# Patient Record
Sex: Female | Born: 1940 | Race: White | Hispanic: No | Marital: Married
Health system: Southern US, Community
[De-identification: ages and names within clinical notes are randomized; demographics above are authoritative.]

---

## 2005-02-24 ENCOUNTER — Ambulatory Visit: Payer: Self-pay | Admitting: Rheumatology

## 2005-03-04 ENCOUNTER — Ambulatory Visit: Payer: Self-pay | Admitting: Rheumatology

## 2005-04-10 ENCOUNTER — Ambulatory Visit: Payer: Self-pay | Admitting: Internal Medicine

## 2005-07-03 ENCOUNTER — Ambulatory Visit: Payer: Self-pay | Admitting: Internal Medicine

## 2005-07-28 ENCOUNTER — Ambulatory Visit: Payer: Self-pay | Admitting: Rheumatology

## 2005-08-18 ENCOUNTER — Ambulatory Visit: Payer: Self-pay | Admitting: Anesthesiology

## 2005-09-04 ENCOUNTER — Ambulatory Visit: Payer: Self-pay | Admitting: Anesthesiology

## 2005-09-30 ENCOUNTER — Ambulatory Visit: Payer: Self-pay | Admitting: Anesthesiology

## 2005-11-11 ENCOUNTER — Ambulatory Visit: Payer: Self-pay | Admitting: Internal Medicine

## 2005-11-17 ENCOUNTER — Ambulatory Visit: Payer: Self-pay | Admitting: Anesthesiology

## 2005-12-30 ENCOUNTER — Ambulatory Visit: Payer: Self-pay | Admitting: Anesthesiology

## 2006-01-25 ENCOUNTER — Emergency Department: Payer: Self-pay | Admitting: Emergency Medicine

## 2006-01-25 ENCOUNTER — Emergency Department: Payer: Self-pay | Admitting: General Practice

## 2006-04-08 ENCOUNTER — Other Ambulatory Visit: Payer: Self-pay

## 2006-04-24 ENCOUNTER — Inpatient Hospital Stay: Payer: Self-pay | Admitting: General Practice

## 2006-06-16 ENCOUNTER — Ambulatory Visit: Payer: Self-pay | Admitting: Internal Medicine

## 2006-08-06 ENCOUNTER — Inpatient Hospital Stay: Payer: Self-pay | Admitting: Internal Medicine

## 2006-08-06 ENCOUNTER — Other Ambulatory Visit: Payer: Self-pay

## 2006-09-09 ENCOUNTER — Ambulatory Visit: Payer: Self-pay | Admitting: Internal Medicine

## 2006-09-14 ENCOUNTER — Encounter: Payer: Self-pay | Admitting: Pulmonary Disease

## 2006-09-26 ENCOUNTER — Encounter: Payer: Self-pay | Admitting: Pulmonary Disease

## 2006-10-27 ENCOUNTER — Encounter: Payer: Self-pay | Admitting: Pulmonary Disease

## 2006-11-27 ENCOUNTER — Encounter: Payer: Self-pay | Admitting: Pulmonary Disease

## 2007-01-06 ENCOUNTER — Ambulatory Visit: Payer: Self-pay | Admitting: Rheumatology

## 2007-03-31 ENCOUNTER — Ambulatory Visit: Payer: Self-pay | Admitting: Internal Medicine

## 2007-06-21 ENCOUNTER — Ambulatory Visit: Payer: Self-pay | Admitting: Internal Medicine

## 2007-07-18 ENCOUNTER — Ambulatory Visit: Payer: Self-pay | Admitting: Internal Medicine

## 2007-08-17 ENCOUNTER — Ambulatory Visit: Payer: Self-pay | Admitting: Anesthesiology

## 2007-09-10 ENCOUNTER — Ambulatory Visit: Payer: Self-pay | Admitting: Anesthesiology

## 2007-10-11 ENCOUNTER — Ambulatory Visit: Payer: Self-pay | Admitting: Anesthesiology

## 2007-11-03 ENCOUNTER — Ambulatory Visit: Payer: Self-pay | Admitting: Anesthesiology

## 2007-12-20 ENCOUNTER — Ambulatory Visit: Payer: Self-pay | Admitting: Anesthesiology

## 2007-12-28 ENCOUNTER — Ambulatory Visit: Payer: Self-pay | Admitting: Anesthesiology

## 2008-02-15 ENCOUNTER — Ambulatory Visit: Payer: Self-pay | Admitting: Anesthesiology

## 2008-03-08 IMAGING — CR DG CHEST 2V
1 series · 2 of 2 positions shown · non-contrast
Comparison: none

REASON FOR EXAM: cough and congestion
COMMENTS:

PROCEDURE:     MDR - MDR CHEST PA(OR AP) AND LATERAL  - July 18, 2007  [DATE]
RESULT:     Comparison: 08/10/2006.

[Series 1: view not recorded · 0.17mm/px · 2 of 2 slices shown]
[im 1/2]
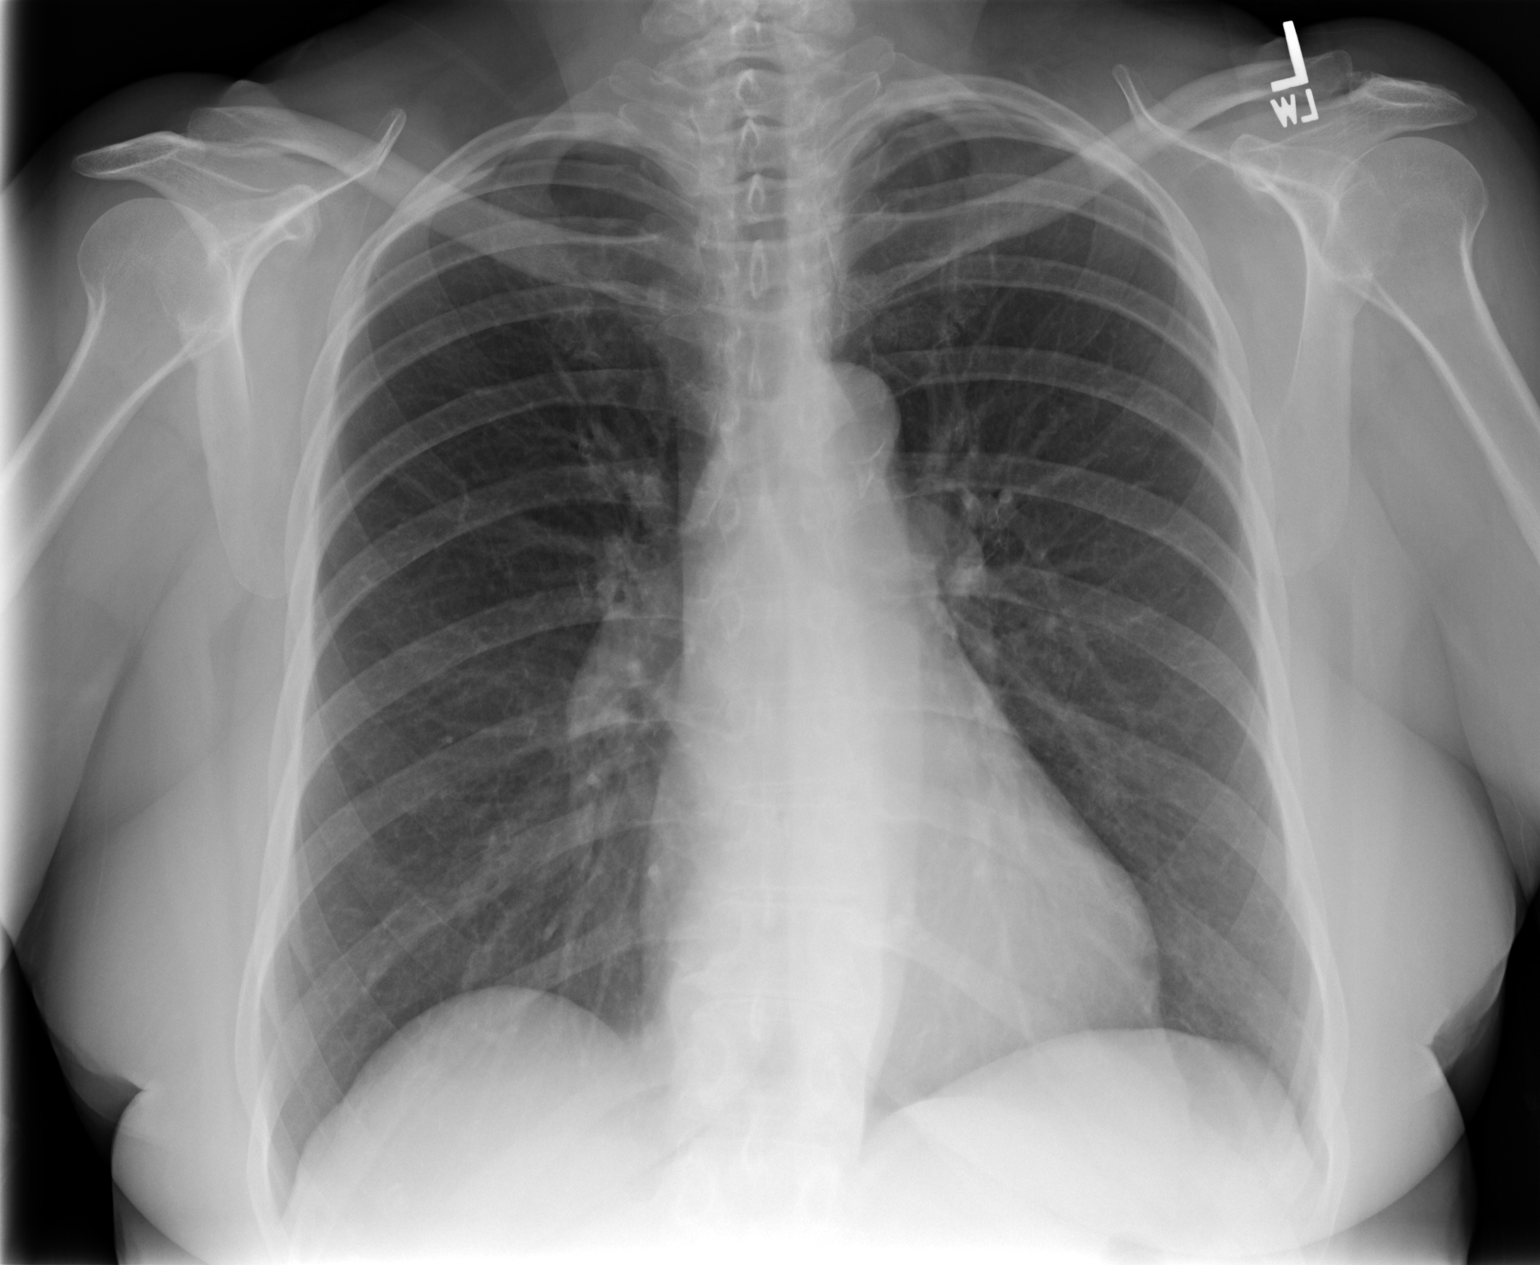
[im 2/2]
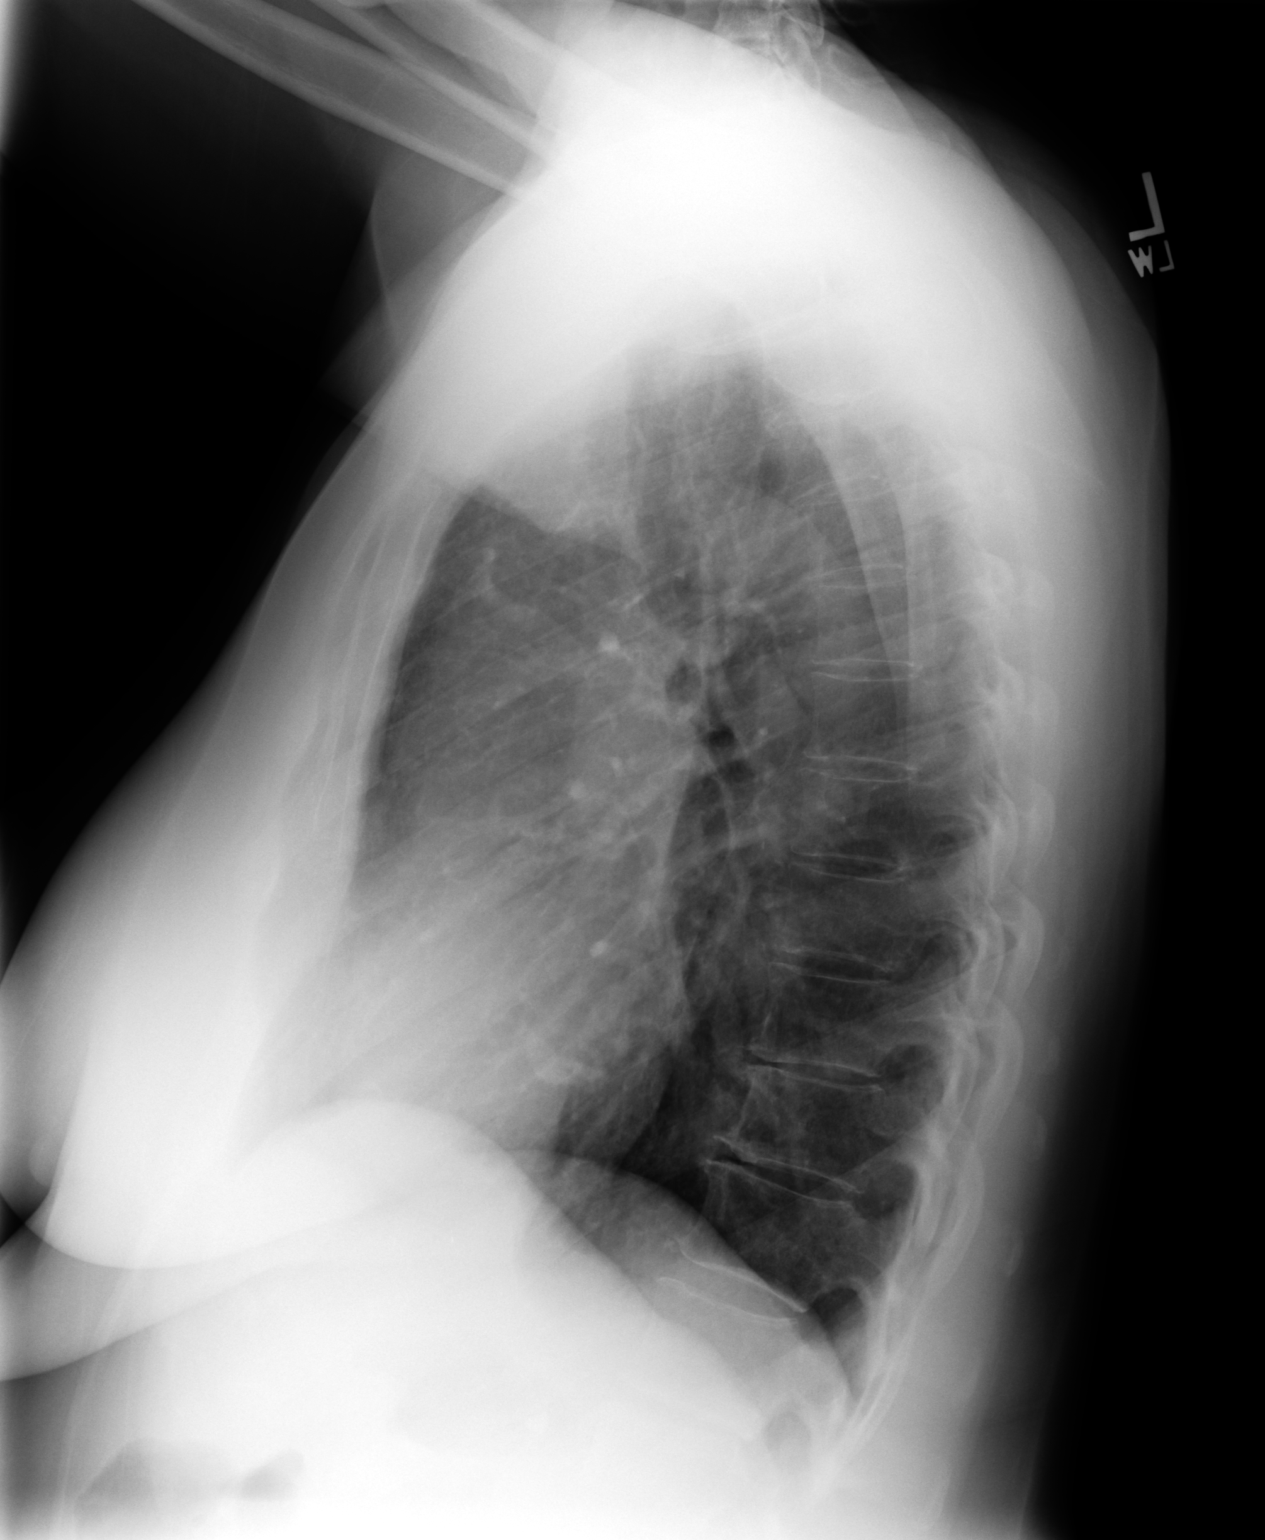

[2 of 2 positions shown; findings below may reference images not displayed]

FINDINGS: There is no significant pulmonary consolidation, pulmonary edema, pleural
effusion, nor pneumothorax. The cardiomediastinal silhouette is within
normal limits.

No grossly displaced rib fracture is noted.
IMPRESSION: 1. No acute cardiopulmonary abnormality is noted.

## 2008-06-22 ENCOUNTER — Ambulatory Visit: Payer: Self-pay | Admitting: Internal Medicine

## 2008-06-28 ENCOUNTER — Inpatient Hospital Stay: Payer: Self-pay | Admitting: Internal Medicine

## 2008-07-18 ENCOUNTER — Ambulatory Visit: Payer: Self-pay | Admitting: Rheumatology

## 2008-07-27 ENCOUNTER — Ambulatory Visit: Payer: Self-pay | Admitting: Rheumatology

## 2008-07-27 ENCOUNTER — Ambulatory Visit: Payer: Self-pay | Admitting: Family Medicine

## 2009-05-01 ENCOUNTER — Ambulatory Visit: Payer: Self-pay | Admitting: Unknown Physician Specialty

## 2009-06-25 ENCOUNTER — Ambulatory Visit: Payer: Self-pay | Admitting: Internal Medicine

## 2010-03-14 ENCOUNTER — Ambulatory Visit: Payer: Self-pay | Admitting: Internal Medicine

## 2010-08-29 ENCOUNTER — Ambulatory Visit: Payer: Self-pay | Admitting: Internal Medicine

## 2011-08-01 ENCOUNTER — Ambulatory Visit: Payer: Self-pay | Admitting: Unknown Physician Specialty

## 2011-11-06 ENCOUNTER — Ambulatory Visit: Payer: Self-pay | Admitting: Internal Medicine

## 2012-01-19 ENCOUNTER — Ambulatory Visit: Payer: Self-pay | Admitting: Unknown Physician Specialty

## 2012-01-26 LAB — PATHOLOGY REPORT

## 2012-04-10 ENCOUNTER — Ambulatory Visit: Payer: Self-pay | Admitting: Medical

## 2012-04-10 LAB — COMPREHENSIVE METABOLIC PANEL WITH GFR
Albumin: 2.8 g/dL — ABNORMAL LOW
Alkaline Phosphatase: 187 U/L — ABNORMAL HIGH
Anion Gap: 8
BUN: 26 mg/dL — ABNORMAL HIGH
Bilirubin,Total: 0.9 mg/dL
Calcium, Total: 9.1 mg/dL
Chloride: 94 mmol/L — ABNORMAL LOW
Co2: 28 mmol/L
Creatinine: 1.65 mg/dL — ABNORMAL HIGH
EGFR (African American): 36 — ABNORMAL LOW
EGFR (Non-African Amer.): 31 — ABNORMAL LOW
Glucose: 115 mg/dL — ABNORMAL HIGH
Osmolality: 266
Potassium: 4 mmol/L
SGOT(AST): 26 U/L
SGPT (ALT): 32 U/L
Sodium: 130 mmol/L — ABNORMAL LOW
Total Protein: 6.9 g/dL

## 2012-04-10 LAB — CBC WITH DIFFERENTIAL/PLATELET
Basophil #: 0 10*3/uL (ref 0.0–0.1)
Eosinophil %: 0 %
HCT: 37.7 % (ref 35.0–47.0)
HGB: 12.2 g/dL (ref 12.0–16.0)
Lymphocyte #: 0.4 10*3/uL — ABNORMAL LOW (ref 1.0–3.6)
MCHC: 32.4 g/dL (ref 32.0–36.0)
Monocyte #: 0.9 x10 3/mm (ref 0.2–0.9)
Monocyte %: 6.5 %
Platelet: 140 10*3/uL — ABNORMAL LOW (ref 150–440)
RBC: 4.11 10*6/uL (ref 3.80–5.20)
WBC: 13.7 10*3/uL — ABNORMAL HIGH (ref 3.6–11.0)

## 2012-04-19 ENCOUNTER — Ambulatory Visit: Payer: Self-pay | Admitting: Rheumatology

## 2012-05-09 ENCOUNTER — Ambulatory Visit: Payer: Self-pay | Admitting: Family Medicine

## 2012-05-09 LAB — URINALYSIS, COMPLETE
Glucose,UR: NEGATIVE mg/dL (ref 0–75)
Ketone: NEGATIVE
Nitrite: NEGATIVE
Ph: 6 (ref 4.5–8.0)
Specific Gravity: 1.015 (ref 1.003–1.030)

## 2012-05-11 LAB — URINE CULTURE

## 2012-11-09 ENCOUNTER — Ambulatory Visit: Payer: Self-pay | Admitting: Internal Medicine

## 2013-02-22 ENCOUNTER — Encounter: Payer: Self-pay | Admitting: Rheumatology

## 2013-02-24 ENCOUNTER — Encounter: Payer: Self-pay | Admitting: Rheumatology

## 2013-03-27 ENCOUNTER — Encounter: Payer: Self-pay | Admitting: Rheumatology

## 2013-08-02 ENCOUNTER — Inpatient Hospital Stay: Payer: Self-pay | Admitting: Internal Medicine

## 2013-08-02 LAB — COMPREHENSIVE METABOLIC PANEL WITH GFR
Albumin: 3.9 g/dL
Alkaline Phosphatase: 130 U/L
Anion Gap: 6 — ABNORMAL LOW
BUN: 27 mg/dL — ABNORMAL HIGH
Bilirubin,Total: 0.6 mg/dL
Calcium, Total: 9.2 mg/dL
Chloride: 98 mmol/L
Co2: 32 mmol/L
Creatinine: 1.49 mg/dL — ABNORMAL HIGH
EGFR (African American): 40 — ABNORMAL LOW
EGFR (Non-African Amer.): 34 — ABNORMAL LOW
Glucose: 106 mg/dL — ABNORMAL HIGH
Osmolality: 277
Potassium: 3.5 mmol/L
SGOT(AST): 46 U/L — ABNORMAL HIGH
SGPT (ALT): 43 U/L
Sodium: 136 mmol/L
Total Protein: 6.7 g/dL

## 2013-08-02 LAB — PRO B NATRIURETIC PEPTIDE: B-Type Natriuretic Peptide: 5806 pg/mL — ABNORMAL HIGH

## 2013-08-02 LAB — CK-MB: CK-MB: 6.1 ng/mL — ABNORMAL HIGH (ref 0.5–3.6)

## 2013-08-02 LAB — URINALYSIS, COMPLETE
Bacteria: NONE SEEN
Bilirubin,UR: NEGATIVE
Blood: NEGATIVE
Glucose,UR: NEGATIVE mg/dL
Hyaline Cast: 5
Ketone: NEGATIVE
Nitrite: NEGATIVE
Ph: 7
Protein: NEGATIVE
RBC,UR: 1 /HPF
Specific Gravity: 1.004
Squamous Epithelial: 1
WBC UR: 4 /HPF

## 2013-08-02 LAB — CBC
HCT: 36.1 % (ref 35.0–47.0)
HGB: 11.9 g/dL — ABNORMAL LOW (ref 12.0–16.0)
MCH: 29.3 pg (ref 26.0–34.0)
MCHC: 33 g/dL (ref 32.0–36.0)
MCV: 89 fL (ref 80–100)
Platelet: 138 10*3/uL — ABNORMAL LOW (ref 150–440)
RDW: 16.8 % — ABNORMAL HIGH (ref 11.5–14.5)
WBC: 6.7 10*3/uL (ref 3.6–11.0)

## 2013-08-02 LAB — DIFFERENTIAL
Basophil #: 0 10*3/uL (ref 0.0–0.1)
Basophil %: 0.5 %
Eosinophil %: 0.8 %
Lymphocyte #: 0.8 10*3/uL — ABNORMAL LOW (ref 1.0–3.6)
Neutrophil #: 5.4 10*3/uL (ref 1.4–6.5)
Neutrophil %: 80.9 %

## 2013-08-02 LAB — CK: CK, Total: 121 U/L (ref 21–215)

## 2013-08-02 LAB — TROPONIN I
Troponin-I: 0.08 ng/mL — ABNORMAL HIGH
Troponin-I: 0.07 ng/mL — ABNORMAL HIGH

## 2013-08-02 LAB — LIPASE, BLOOD: Lipase: 115 U/L (ref 73–393)

## 2013-08-03 LAB — COMPREHENSIVE METABOLIC PANEL
Albumin: 3.2 g/dL — ABNORMAL LOW (ref 3.4–5.0)
BUN: 26 mg/dL — ABNORMAL HIGH (ref 7–18)
Bilirubin,Total: 0.4 mg/dL (ref 0.2–1.0)
Calcium, Total: 8.5 mg/dL (ref 8.5–10.1)
Chloride: 102 mmol/L (ref 98–107)
Co2: 34 mmol/L — ABNORMAL HIGH (ref 21–32)
Creatinine: 1.49 mg/dL — ABNORMAL HIGH (ref 0.60–1.30)
EGFR (Non-African Amer.): 34 — ABNORMAL LOW
Glucose: 109 mg/dL — ABNORMAL HIGH (ref 65–99)
SGOT(AST): 37 U/L (ref 15–37)
SGPT (ALT): 36 U/L (ref 12–78)
Total Protein: 5.6 g/dL — ABNORMAL LOW (ref 6.4–8.2)

## 2013-08-03 LAB — CBC WITH DIFFERENTIAL/PLATELET
Eosinophil #: 0 10*3/uL (ref 0.0–0.7)
Eosinophil %: 0.7 %
Lymphocyte #: 0.9 10*3/uL — ABNORMAL LOW (ref 1.0–3.6)
Lymphocyte %: 15.2 %
MCH: 29.5 pg (ref 26.0–34.0)
MCHC: 33.3 g/dL (ref 32.0–36.0)
Platelet: 126 10*3/uL — ABNORMAL LOW (ref 150–440)
RBC: 3.72 10*6/uL — ABNORMAL LOW (ref 3.80–5.20)
RDW: 16.8 % — ABNORMAL HIGH (ref 11.5–14.5)
WBC: 5.9 10*3/uL (ref 3.6–11.0)

## 2013-08-03 LAB — CK TOTAL AND CKMB (NOT AT ARMC)
CK, Total: 84 U/L (ref 21–215)
CK-MB: 5.4 ng/mL — ABNORMAL HIGH (ref 0.5–3.6)
CK-MB: 4.2 ng/mL — ABNORMAL HIGH (ref 0.5–3.6)

## 2013-08-03 LAB — TROPONIN I: Troponin-I: 0.08 ng/mL — ABNORMAL HIGH

## 2013-08-03 LAB — APTT: Activated PTT: >160 secs (ref 23.6–35.9)

## 2013-08-04 LAB — COMPREHENSIVE METABOLIC PANEL
Alkaline Phosphatase: 115 U/L (ref 50–136)
Calcium, Total: 8.9 mg/dL (ref 8.5–10.1)
Chloride: 100 mmol/L (ref 98–107)
Co2: 34 mmol/L — ABNORMAL HIGH (ref 21–32)
Glucose: 89 mg/dL (ref 65–99)
Potassium: 4.2 mmol/L (ref 3.5–5.1)
SGPT (ALT): 41 U/L (ref 12–78)
Total Protein: 5.6 g/dL — ABNORMAL LOW (ref 6.4–8.2)

## 2013-08-04 LAB — CBC WITH DIFFERENTIAL/PLATELET
Basophil %: 0.4 %
Eosinophil %: 0.5 %
HCT: 33.2 % — ABNORMAL LOW (ref 35.0–47.0)
HGB: 11.1 g/dL — ABNORMAL LOW (ref 12.0–16.0)
Lymphocyte #: 1 10*3/uL (ref 1.0–3.6)
Lymphocyte %: 15.1 %
MCHC: 33.3 g/dL (ref 32.0–36.0)
MCV: 89 fL (ref 80–100)
Neutrophil #: 5.1 10*3/uL (ref 1.4–6.5)
Neutrophil %: 74.7 %
Platelet: 130 10*3/uL — ABNORMAL LOW (ref 150–440)
WBC: 6.9 10*3/uL (ref 3.6–11.0)

## 2013-08-05 LAB — CBC WITH DIFFERENTIAL/PLATELET
Eosinophil %: 0.7 %
Lymphocyte #: 1.3 10*3/uL (ref 1.0–3.6)
Lymphocyte %: 18.4 %
MCHC: 32.9 g/dL (ref 32.0–36.0)
MCV: 90 fL (ref 80–100)
Neutrophil #: 5 10*3/uL (ref 1.4–6.5)
RBC: 3.72 10*6/uL — ABNORMAL LOW (ref 3.80–5.20)
RDW: 16.7 % — ABNORMAL HIGH (ref 11.5–14.5)
WBC: 7.1 10*3/uL (ref 3.6–11.0)

## 2013-08-05 LAB — BASIC METABOLIC PANEL
Calcium, Total: 8.7 mg/dL (ref 8.5–10.1)
Co2: 30 mmol/L (ref 21–32)
EGFR (African American): 44 — ABNORMAL LOW
EGFR (Non-African Amer.): 38 — ABNORMAL LOW
Osmolality: 278 (ref 275–301)
Potassium: 4.4 mmol/L (ref 3.5–5.1)
Sodium: 138 mmol/L (ref 136–145)

## 2014-01-27 ENCOUNTER — Ambulatory Visit: Payer: Self-pay | Admitting: Family Medicine

## 2014-11-21 ENCOUNTER — Ambulatory Visit: Payer: Self-pay | Admitting: Family Medicine

## 2015-02-16 NOTE — Consult Note (Signed)
PATIENT NAME:  [name], [name]#:  [id number] DATE OF BIRTH:  January 23, 1941  DATE OF CONSULTATION:  08/03/2013  REFERRING PHYSICIAN:  Dr. Marcello FennelHande. CONSULTING PHYSICIAN:  Marcina MillardAlexander Jennine Peddy, MD  CHIEF COMPLAINT: Mid epigastric, right lower quadrant discomfort.   REASON FOR CONSULTATION: Consultation requested for evaluation of borderline elevated troponin.   HISTORY OF PRESENT ILLNESS: The patient is a 74 year old female with known history of CREST syndrome, as well as pulmonary hypertension and ischemic colitis. The patient reports that she has had a history of midepigastric discomfort. One week prior to admission, the patient has noted increasing midepigastric, right lower quadrant discomfort with associated nausea. The patient has had some moderately decreased p.o. intake. [facility] Room where abdominal ultrasound was performed, which did reveal evidence for gallstones. The patient had borderline elevated troponin of 0.08 in the absence of chest pain. Other admission labs were notable for an elevated BUN and creatinine at 27 and 1.49, respectively.   PAST MEDICAL HISTORY: 1.  CREST syndrome with scleroderma.  2.  Pulmonary hypertension on chronic O2 therapy.  3.  History of gastroesophageal reflux disease.  4.  History of ischemic colitis.  5.  Hypothyroidism.   MEDICATIONS: Lasix 40 mg b.i.d., amlodipine 5 mg daily, lisinopril 20 mg daily, hydroxychloroquine 400 mg daily, ginkgo tablets 60 mg daily, calcium citrate 1 daily,  magnesium 100 mg daily, Osteo Bi-Flex 2 tablets daily, iron sulfate 325 mg daily, tramadol 50 mg t.i.d. p.r.n., omeprazole 20 mg b.i.d., levothyroxine 50 mcg daily, Uloric 40 mg daily, ropinirole 0.25 mg daily for.   SOCIAL HISTORY: The patient is married. She is a retired Veterinary surgeoncounselor. She has 2 grown children. She has never smoked.   FAMILY HISTORY:  She has 1 brother who is deceased following myocardial infarction.     REVIEW OF SYSTEMS:    CONSTITUTIONAL: No fever or chills.  EYES: No blurry vision.  EARS: No hearing loss.  RESPIRATORY: The patient has chronic shortness of breath due to underlying pulmonary hypertension on chronic O2 therapy.  CARDIOVASCULAR: The patient denies chest pain.  GASTROINTESTINAL: The patient has had mid epigastric right lower quadrant discomfort with nausea.  GENITOURINARY: No dysuria or hematuria.  ENDOCRINE: No polyuria or polydipsia.  MUSCULOSKELETAL: The patient has many skeletal deformities, including her hands where she has had multiple surgeries.  NEUROLOGICAL: No focal muscle weakness or numbness.  PSYCHOLOGICAL: No depression or anxiety.   PHYSICAL EXAMINATION: VITAL SIGNS: Blood pressure 96/56, pulse 78, respirations 18, temperature 97.6, pulse ox 92%.  HEENT: Pupils equal and reactive to light and accommodation.  NECK: Supple without thyromegaly.  LUNGS: Reveal decreased breath sounds in both bases.  CARDIOVASCULAR: Normal JVP. Normal PMI. Regular rate and rhythm. Normal S1, S2. No appreciable gallop, murmur or rub.  ABDOMEN: Soft, nontender.  EXTREMITIES: The patient has deformities in both her hands.  NEUROLOGIC: The patient is alert and oriented x 3. Motor and sensory both grossly intact.   IMPRESSION: A 74 year old female with calcinosis, Raynaud phenomenon, esophageal dysmotility, sclerodactyly, telangiectasia syndrome and pulmonary hypertension, who presents with mid epigastric, right lower quadrant and right upper quadrant discomfort, found to have gallstones on gallbladder ultrasound. The patient had borderline elevated troponin which I suspect would be due to demand/supply ischemia, especially in light of recent decreased p.o. intake, chronic kidney disease, elevated creatinine in the absence of chest pain.   RECOMMENDATIONS: 1.  Agree with current therapy.  2.  Would defer full-dose anticoagulation.  3.  Consider 2-D echocardiogram to evaluate  left ventricular function.   4.  Would defer a functional study at this time.  5.  Any further recommendations pending echocardiogram results.    ____________________________ Marcina Millard, MD ap:dmm D: 08/03/2013 16:10:96 ET T: 08/03/2013 09:50:08 ET JOB#: [id number]  cc: Marcina Millard, MD, <Dictator> Marcina Millard MD ELECTRONICALLY SIGNED 08/08/2013 11:27

## 2015-02-16 NOTE — Consult Note (Signed)
PATIENT NAME:  [name], [name] MR#:  [id number] DATE OF BIRTH:  09-14-1941  DATE OF CONSULTATION:  08/04/2013  CONSULTING PHYSICIAN:  Loraine LericheMark A. Egbert GaribaldiBird, MD  HISTORY OF PRESENT ILLNESS: A 74 year old white female with a history of CREST, scleroderma, moderately severe pulmonary artery hypertension, chronic oxygen dependency, and a history of intermittent postprandial right upper quadrant and abdominal pain associated with occasional nausea was admitted to the medical service 2 days ago with significant sudden onset of epigastric pain, lower chest pain, and right upper quadrant abdominal pain which radiated into her back associated with pain between her shoulder blades.   She thinks that this occurred after eating. In the past, she is unclear whether pain causes this but has noted a couple episodes of abdominal pain such as this but less intense following fatty meals.   She was found to have slightly elevated troponins.   Of note, the patient has had an extensive past surgical history with hysterectomy as well as bilateral salpingo-oophorectomy and partial sigmoid colectomy. Surgical services were asked to consult following the discovery of gallstones seen on ultrasonography without any signs of acute cholecystitis and persistent right upper quadrant abdominal pain.   She has been seen by cardiology. [facility] demonstrated a pulmonary artery pressure of 90 mmHg based on estimation from echocardiography. The patient is markedly short of breath with minimal exertion. Currently she is feeling better. She is hungry. She is interested going home.   ALLERGIES: WELL DEFINED IN THE CHART.   MEDICATIONS: Well defined in the chart.   PAST MEDICAL HISTORY: Hypertension, CREST syndrome, gastroesophageal reflux disease, history of pulmonary hypertension, history of ischemic colitis, history of cholecystectomy, right hand surgery, oophorectomy with partial colectomy, complicating one of  those operations.   SOCIAL HISTORY: The patient does not smoke. Does not drink.   FAMILY HISTORY: Significant for cholecystectomy as well as CREST and myocardial infarction, breast carcinoma.   REVIEW OF SYSTEMS: Significant for abdominal pain as described above and as per ten-point review; otherwise, unremarkable.   PHYSICAL EXAMINATION:  GENERAL: The patient is seen with her husband at the bedside.  VITAL SIGNS: Temperature 98, pulse 72, blood pressure 101/64. Affect is normal, alert and oriented.  LUNGS: Clear.  HEART: Regular rate and rhythm.  ABDOMEN: Soft. There is a midline incision with no obvious hernia. There are no peritoneal signs. There is minimal tenderness to very deep palpation within the right upper quadrant.  EXTREMITIES: Warm and well perfused. There are multiple hand and metacarpal abnormalities consistent with long-standing CREST syndrome.   LABORATORY DATA: Values were reviewed, demonstrating no evidence of liver function test abnormality. No leukocytosis. Ultrasound and HIDA scan were both reviewed. HIDA scan dated earlier today demonstrates no evidence of common bile duct or cystic duct obstruction. Gallbladder ultrasound demonstrating gallstones.   IMPRESSION: A 74 year old white female with biliary colic and severe pulmonary hypertension at moderate to high risk for surgery. At present, I see no indication for urgent operation.   RECOMMENDATIONS: I would recommend the patient be seen by her [facility] pulmonologist for risk stratification. Certainly her diet can be advanced. Recommend discharge on pain meds and oral antibiotics.  I will see her in the office in two weeks' time following pulmonary evaluation [facility].   ____________________________ Redge GainerMark A. Egbert GaribaldiBird, MD mab:np D: 08/04/2013 16:17:37 ET T: 08/04/2013 16:59:32 ET JOB#: [id number]  cc: Loraine LericheMark A. Egbert GaribaldiBird, MD, <Dictator> Raynald KempMARK A Marcena Dias MD ELECTRONICALLY SIGNED 08/07/2013 17:04

## 2015-02-16 NOTE — H&P (Signed)
PATIENT NAME:  [name], [name] MR#:  [id number] DATE OF BIRTH:  Mar 08, 1941  DATE OF ADMISSION:  08/02/2013  PRIMARY CARE PHYSICIAN: Dr. Marcello FennelHande.  The patient is a 74 year old Caucasian female with history of CREST, hypertension, gastroesophageal reflux disease, history of pulmonary hypertension, history of admission in September 2009 for ischemic colitis, who presents to the hospital with complaints of not feeling well. According to the patient, she felt as if she was having gallbladder problems. Over the weekend, she was having problems with increasing nausea, some pain under the right breast area the radiating to the back. Today, however, at nighttime she woke up because of the pain in between her shoulder blades, She also had some gnawing discomfort in the epigastric area with no alleviating or aggravating factors; however, upon further questioning, she admits that when she lays down on the right side, the pain was increasing. She also admitted that her nausea seemed to be increasing whenever she walked around.  She also was complaining of  worsening shortness of breath. Because of this epigastric discomfort, as well as increasing nausea and shortness of breath, she decided to come to the Emergency Room for further evaluation. In the Emergency Room, she had ultrasound of her right upper quadrant, which was remarkable for gallstones, but no other abnormalities. Her liver enzymes were remarkable for mild elevation of AST, but main concern was troponin elevation of 0.08, so hospitalist services were contacted for admission.   PAST MEDICAL HISTORY: Significant for history of gastroesophageal reflux disease, CREST, hypertension, history of interstitial lung disease on oxygen therapy 24/7 at 3 liters of oxygen per nasal cannula, history of chronic back pain, lower GI bleed, which was felt to be due to ischemic colitis in September 2009; history of pulmonary hypertension, hypothyroidism.   MEDICATIONS: The  patient is on amlodipine 5 mg p.o. daily, calcium with vitamin D 1 tablet once daily, Lasix 40 mg twice daily, gingko biloba 1 tablet daily, (Dictation Anomaly) hydroxychloroquine at 400 mg daily,  levothyroxine 50 mcg p.o. daily, lisinopril 10 mg p.o. daily, magnesium oxide 1 tablet once daily, omeprazole 10 mg p.o. twice daily, multivitamins once daily, prednisone 5 mg p.o. daily, ropinirole 0.25 mg p.o. at 12 noon, ropinirole 1 mg p.o. in the evening, tramadol 50 mg p.o. at bedtime as needed, Tylenol PM 500/25 once at bedtime.   ALLERGIES:  LEVAQUIN, AZITHROMYCIN, AUGMENTIN AS WELL AS ON BETA BLOCKERS, WHICH CAUSES THE PATIENT BEING LETHARGIC.   SURGICAL HISTORY:  Left hip replacement surgery 6 years ago, cataract bilateral surgery, history of right hand joint replacement in 1999, history of hysterectomy and oophorectomy between 2001 and 2003. History of partial colectomy secondary to complications of ovarian infection.   SOCIAL HISTORY: [place] No tobacco, alcohol or drug abuse.   FAMILY HISTORY:  The patient's mother has colon cancer, metastasis, diagnosed at age of 76. The patient's sister has breast carcinoma, diagnosed at age 76.  The patient's brother has CREST.  Another brother had a myocardial infarction at the age of 76. Another brother has a history of stroke and diabetes.   REVIEW OF SYSTEMS:  CONSTITUTIONAL:  Positive for epigastrium and right upper quadrant abdominal pain, some nausea, as well as increasing shortness of breath over the period of  past few days. Weight gain of approximately 5 pounds since 1 week ago. Some lower extremity swelling, which seemed to be worsening over the period of the past few weeks. Also dyspnea on exertion, as well as  nausea, as mentioned above. The patient was diagnosed with urinary tract infection 3 weeks ago for which she took antibiotics for approximately 1 week. Denies any fevers, chills, fatigue, weakness, weight loss.    EYES: In regards to eyes, denies any blurry vision, double vision, glaucoma or cataracts.  EARS, NOSE, THROAT:  Denies any tinnitus, allergies, epistaxis, sinus pain, dentures or difficulty swallowing.  RESPIRATORY: Denies any wheezes, asthma or COPD.  CARDIOVASCULAR: Denies any orthopnea, edema, arrhythmias, palpitations or syncope.  GASTROINTESTINAL:  Denies any vomiting, diarrhea or constipation.  GENITOURINARY: Denies dysuria, hematuria, frequency or incontinence.  ENDOCRINOLOGY: Denies any polydipsia, nocturia, thyroid problems,  heat or cold intolerance. No thirst.  HEMATOLOGIC: Denies anemia, easy bruising, bleeding or swollen glands.  SKIN: Denies acne, rashes, lesions or moles.  MUSCULOSKELETAL: Denies arthritis, cramps, swelling or gout.  NEUROLOGIC: Denies numbness, epilepsy or tremors.  PSYCHIATRIC: Denies anxiety, insomnia or depression.   PHYSICAL EXAMINATION: VITAL SIGNS: On arrival to the hospital, temperature is 97.6, pulse was 84, respiratory rate was 18, blood pressure 119/59, saturation was 98% on oxygen therapy.  GENERAL: This is a well-developed, well-nourished Caucasian female in no significant distress, lying on the stretcher.  HEENT: Her pupils are equal, reactive to light. Extraocular movements intact. No icterus or conjunctivitis. Has normal hearing. No pharyngeal erythema. Mucosa is moist.  NECK: No masses. Supple, nontender. Thyroid is not enlarged. No adenopathy. No JVD or carotid bruits bilaterally. Full range of motion.  LUNGS: Clear to auscultation in all fields.  (Dictation Anomaly) no rales, rhonchi,  diminished breath sounds or wheezing. No labored inspirations, increased effort, dullness to percussion or overt respiratory distress.  CARDIOVASCULAR: S1, S2 appreciated. A 2/6 systolic murmur is heard in mitral auscultation site. PMI is not lateralized. Chest is nontender to palpation. 1+ pedal pulses, 1 to 2+ lower extremity edema bilaterally. No calf  tenderness or cyanosis were noted.  ABDOMEN: Soft, nontender. Bowel sounds are present. No hepatosplenomegaly or masses were noted.  RECTAL: Deferred.  MUSCLE STRENGTH: Able to move all extremities. No cyanosis, degenerative joint disease or kyphosis.  Gait is not tested.  SKIN: Denies any rashes, lesions, erythema, nodularity or induration. It was warm and dry to palpation.  LYMPHATIC: No adenopathy in the cervical region.  NEUROLOGICAL: Cranial nerves grossly intact. Sensory is intact. No dysarthria or aphasia. The patient is alert and oriented to time, person, place, cooperative. Memory is good.  PSYCHIATRIC: No significant confusion, agitation or depression noted.   LABORATORY AND RADIOLOGICAL DATA: BMP showed a glucose of (Dictation Anomaly) 106 . BUN and creatinine were 27 and 1.49, otherwise BMP was unremarkable. The patient's beta-type natriuretic peptide was 5806. Liver enzymes: AST elevation to 46. Troponin is elevated to 0.08. White blood cell count is normal at 6.7, hemoglobin 11.9, platelet count 138. Urinalysis revealed straw clear urine, negative for glucose, bilirubin or ketones. Specific gravity 1.004, pH was 7.0, negative for blood, protein, nitrites; 1+ leukocyte esterase, 1 red blood cell, 4 white blood cells, no bacteria, 1 epithelial cell. Mucus was present, as well as 5 hyaline casts. Ultrasound of abdomen, limited survey, revealed gallstones, otherwise no acute  abnormalities. EKG showed sinus rhythm with first-degree AV block, (Dictation Anomaly) left posterior fascicular block, inferior infarct, age undetermined. Left fascicular block, right axis deviation, inferior infarct, age undetermined with Q waves in inferior leads, as well as ST-T depressions in the inferior leads. The patient also has T depression in V3. No significant change, however, since June 2013.    ASSESSMENT AND  PLAN: 1.  Epigastric discomfort, exertional with elevated troponin level. Questionable acute coronary  syndrome. We will admit the patient to the medical floor. We will start the patient on beta blocker, aspirin, nitroglycerin, as well as heparin IV. We will follow cardiac enzymes. We will ask cardiologist to see the patient in consultation. Will also have Doppler ultrasound of lower extremities. The patient would benefit from pulmonary embolism evaluation since the patient has pulmonary hypertension, and she is at risk of pulmonary embolism. We will continue heparin IV for now.  2.  Renal insufficiency. We will hold ACE inhibitor, as well as Lasix.  3.  Lower extremity swelling. As mentioned above, we will get d-dimer, as well as lower extremity Dopplers.  4.  Hypertension. We will hold ACE inhibitor while she is on nitroglycerin, as well as beta blocker.  5.  History of gallstones.  No intervention at this time. Suggested surgical evaluation as an outpatient.  6.  Elevated transaminases, AST could have been due to CK total elevation. We will check CK total level. We will follow her liver enzymes.   TIME SPENT: 50 minutes on the patient.    ____________________________ Katharina Caper, MD rv:dmm D: 08/02/2013 20:45:58 ET T: 08/02/2013 21:41:22 ET JOB#: [id number]  cc: Katharina Caper, MD, <Dictator> Barbette Reichmann, MD Eduardo Honor MD ELECTRONICALLY SIGNED 08/21/2013 12:35

## 2015-02-16 NOTE — Discharge Summary (Signed)
PATIENT NAME:  [name], Mulki E MR#:  [id number] DATE OF BIRTH:  [birth year]  DATE OF ADMISSION:  08/04/2013 DATE OF DISCHARGE:  08/05/2013  DISCHARGE DIAGNOSES: 1.  Cholecystitis. 2.  Pulmonary hypertension, history of.   DISCHARGE MEDICATIONS: Per [facility] med reconciliation system. Please see for details.   HISTORY AND PHYSICAL: Please see detailed history and physical done on this admission.   HOSPITAL COURSE: The patient was [facility] hospital[facility] and cared for by Dr. Marcello FennelHande on 10/08 and 10/09.  He is off today. He asked me to see her today. She had diagnosis of cholecystitis confirmed by ultrasound and history. HIDA scan was in fact normal. General surgery, ultimately was consulted, who did feel she had cholecystitis but did not think it was pertinent for them to do her surgery given the pulmonary hypertension and [place] pul[place] in [place] [facility]. Therefore they advanced her diet. She tolerated the low fat diet reasonably well and was ready to be discharged this morning. I had a long discussion with her about this. She understands if she has recurrent pain and she not in distress it will be best for her to present to the [facility] as their surgeons may be more comfortable  doing the surgery with her pulmonologist close by and able to assist with her care. She is on 2 liters nasal cannula. Lungs are clear. She is doing well otherwise. Other work-up included minimally elevated troponins thought to be noncardiac. She has what appears to be chronic renal insufficiency with her CREST syndrome with a GFR approximately 38. White blood cell count was 6900 by 10/10, was 6700 on admission as well. Minimally anemic with hemoglobin of 11.9 on admission. AST was elevated at 46 on admission, which was just above the upper limits of normal. She should follow up soon with her pulmonologist and discuss with Dr. Marcello FennelHande if further issues arise preop.   TIME SPENT: It took  approximately 35 minutes to do all discharge summary including figuring out her total hospitalization, discussing with her, evaluating her labs, consults etc.   ____________________________ Marya AmslerMarshall W. Dareen PianoAnderson, MD mwa:dp D: 08/05/2013 07:14:39 ET T: 08/05/2013 07:26:49 ET JOB#: [id number]  cc: Marya AmslerMarshall W. Dareen PianoAnderson, MD, <Dictator> Lauro RegulusMARSHALL W Brodrick Curran MD ELECTRONICALLY SIGNED 08/05/2013 10:08

## 2015-06-28 DEATH — deceased
